# Patient Record
Sex: Female | Born: 1980 | Race: Black or African American | Hispanic: No | Marital: Single | Smoking: Former smoker
Health system: Southern US, Community
[De-identification: ages and names within clinical notes are randomized; demographics above are authoritative.]

## PROBLEM LIST (undated history)

## (undated) DIAGNOSIS — F329 Major depressive disorder, single episode, unspecified: Secondary | ICD-10-CM

## (undated) DIAGNOSIS — F419 Anxiety disorder, unspecified: Secondary | ICD-10-CM

## (undated) DIAGNOSIS — K5090 Crohn's disease, unspecified, without complications: Secondary | ICD-10-CM

## (undated) DIAGNOSIS — F32A Depression, unspecified: Secondary | ICD-10-CM

---

## 2012-01-09 HISTORY — PX: APPENDECTOMY: SHX54

## 2013-01-08 HISTORY — PX: LARYNGOSCOPY: SHX5203

## 2017-09-10 ENCOUNTER — Ambulatory Visit
Admission: EM | Admit: 2017-09-10 | Discharge: 2017-09-10 | Disposition: A | Discharge status: Home / Self Care | Payer: 59 | Attending: Family Medicine | Admitting: Family Medicine

## 2017-09-10 ENCOUNTER — Other Ambulatory Visit: Payer: Self-pay

## 2017-09-10 ENCOUNTER — Encounter: Payer: Self-pay | Admitting: Emergency Medicine

## 2017-09-10 DIAGNOSIS — Z3A12 12 weeks gestation of pregnancy: Secondary | ICD-10-CM

## 2017-09-10 DIAGNOSIS — Z3201 Encounter for pregnancy test, result positive: Secondary | ICD-10-CM

## 2017-09-10 DIAGNOSIS — R109 Unspecified abdominal pain: Secondary | ICD-10-CM | POA: Diagnosis not present

## 2017-09-10 DIAGNOSIS — K219 Gastro-esophageal reflux disease without esophagitis: Secondary | ICD-10-CM | POA: Diagnosis not present

## 2017-09-10 DIAGNOSIS — R112 Nausea with vomiting, unspecified: Secondary | ICD-10-CM | POA: Diagnosis not present

## 2017-09-10 HISTORY — DX: Crohn's disease, unspecified, without complications: K50.90

## 2017-09-10 LAB — PREGNANCY, URINE: Preg Test, Ur: POSITIVE — AB

## 2017-09-10 NOTE — ED Triage Notes (Addendum)
Pt c/o vomiting, nausea, and abdominal pain. Started 3 days ago and has gradually gotten worse. She had been constipated. Pt has had a low grade fever up and down since this started.

## 2017-09-10 NOTE — Discharge Instructions (Addendum)
Vitamin B6, natural ginger products TUMS  Prenatal vitamin daily Increase fluids Establish care with OB

## 2017-09-10 NOTE — ED Provider Notes (Signed)
[facility]    CSN: [id number] Arrival date & time: 09/10/17  1243     History   Chief Complaint Chief Complaint  Patient presents with  . Emesis    HPI Shirley Todd is a 37 y.o. female.   37 yo female with a c/o nausea and abdominal cramping for 3 days. States she vomited once yesterday. Denies any fevers, chills, dysuria, diarrhea. States last LMP was 06/17/17.   The history is provided by the patient.    Past Medical History:  Diagnosis Date  . Crohn's disease ([place])     There are no active problems to display for this patient.   Past Surgical History:  Procedure Laterality Date  . APPENDECTOMY  2014    OB History   None      Home Medications    Prior to Admission medications   Not on File    Family History Family History  Problem Relation Age of Onset  . HIV Mother   . Diabetes Brother     Social History Social History   Tobacco Use  . Smoking status: Former Research scientist (life sciences)  . Smokeless tobacco: Never Used  Substance Use Topics  . Alcohol use: Never    Frequency: Never  . Drug use: Never     Allergies   Patient has no known allergies.   Review of Systems Review of Systems   Physical Exam Triage Vital Signs ED Triage Vitals  Enc Vitals Group     BP 09/10/17 1316 120/88     Pulse Rate 09/10/17 1316 97     Resp 09/10/17 1316 16     Temp 09/10/17 1316 98.8 F (37.1 C)     Temp Source 09/10/17 1316 Oral     SpO2 09/10/17 1316 99 %     Weight 09/10/17 1311 212 lb (96.2 kg)     Height 09/10/17 1311 5' 8"  (1.727 m)     Head Circumference --      Peak Flow --      Pain Score 09/10/17 1310 0     Pain Loc --      Pain Edu? --      Excl. in [place]? --    No data found.  Updated Vital Signs BP 120/88 (BP Location: Left Arm)   Pulse 97   Temp 98.8 F (37.1 C) (Oral)   Resp 16   Ht 5' 8"  (1.727 m)   Wt 96.2 kg   LMP 06/17/2017   SpO2 99%   BMI 32.23 kg/m   Visual Acuity Right Eye Distance:   Left Eye Distance:     Bilateral Distance:    Right Eye Near:   Left Eye Near:    Bilateral Near:     Physical Exam  Constitutional: She appears well-developed and well-nourished. No distress.  Abdominal: Soft. Bowel sounds are normal. She exhibits no distension and no mass. There is no tenderness. There is no rebound and no guarding.  Skin: She is not diaphoretic.  Nursing note and vitals reviewed.    UC Treatments / Results  Labs (all labs ordered are listed, but only abnormal results are displayed) Labs Reviewed  PREGNANCY, URINE - Abnormal; Notable for the following components:      Result Value   Preg Test, Ur POSITIVE (*)    All other components within normal limits    EKG None  Radiology No results found.  Procedures Procedures (including critical care time)  Medications Ordered in UC Medications - No  data to display  Initial Impression / Assessment and Plan / UC Course  I have reviewed the triage vital signs and the nursing notes.  Pertinent labs & imaging results that were available during my care of the patient were reviewed by me and considered in my medical decision making (see chart for details).      Final Clinical Impressions(s) / UC Diagnoses   Final diagnoses:  Non-intractable vomiting with nausea, unspecified vomiting type  [redacted] weeks gestation of pregnancy  Gastroesophageal reflux disease, esophagitis presence not specified     Discharge Instructions     Vitamin B6, natural ginger products TUMS  Prenatal vitamin daily Increase fluids Establish care with [facility]     ED Prescriptions    None     1. Lab results and diagnosis reviewed with patient 2. Recommend supportive treatment as above 3. Follow-up prn if symptoms worsen or don't improve  Controlled Substance Prescriptions [place] Controlled Substance Registry consulted? Not Applicable   Norval Gable, MD 09/10/17 1447

## 2017-09-13 ENCOUNTER — Other Ambulatory Visit: Payer: Self-pay | Admitting: Obstetrics and Gynecology

## 2017-09-13 DIAGNOSIS — Z369 Encounter for antenatal screening, unspecified: Secondary | ICD-10-CM

## 2017-09-16 ENCOUNTER — Ambulatory Visit
Admission: RE | Admit: 2017-09-16 | Discharge: 2017-09-16 | Disposition: A | Discharge status: Home / Self Care | Payer: 59 | Source: Ambulatory Visit | Attending: Obstetrics and Gynecology | Admitting: Obstetrics and Gynecology

## 2017-09-16 VITALS — BP 114/65 | HR 103 | Temp 98.0°F | Resp 16 | Wt 211.4 lb

## 2017-09-16 DIAGNOSIS — O09891 Supervision of other high risk pregnancies, first trimester: Secondary | ICD-10-CM | POA: Diagnosis present

## 2017-09-16 DIAGNOSIS — K5010 Crohn's disease of large intestine without complications: Secondary | ICD-10-CM | POA: Insufficient documentation

## 2017-09-16 DIAGNOSIS — Z3A13 13 weeks gestation of pregnancy: Secondary | ICD-10-CM | POA: Diagnosis not present

## 2017-09-16 DIAGNOSIS — O09521 Supervision of elderly multigravida, first trimester: Secondary | ICD-10-CM | POA: Insufficient documentation

## 2017-09-16 DIAGNOSIS — Z369 Encounter for antenatal screening, unspecified: Secondary | ICD-10-CM

## 2017-09-16 DIAGNOSIS — N979 Female infertility, unspecified: Secondary | ICD-10-CM | POA: Insufficient documentation

## 2017-09-16 NOTE — Progress Notes (Signed)
Referring Provider:   Hassan Buckler Length of Consultation: 30 minutes  Ms. Riling was referred to [facility] for genetic counseling because of advanced maternal age. The patient will be 37 years old at the time of delivery. She was 74w0dat the time of our appointment. Of note, the pregnancy was conceived using IUI and a sperm donor. This note summarizes the information we discussed.    Genetic Counseling We explained that the chance of a chromosome abnormality increases with maternal age.  Chromosomes and examples of chromosome problems were reviewed.  Humans typically have 46 chromosomes in each cell, with half passed through each sperm and egg.  Any change in the number or structure of chromosomes can increase the risk of problems in the physical and mental development of a pregnancy.   Based upon age of the patient, the chance of any chromosome abnormality was 1 in 663 The chance of Down syndrome, the most common chromosome problem associated with maternal age, was 1 in 114  The risk of chromosome problems is in addition to the 3% general population risk for birth defects and mental retardation.  The greatest chance, of course, is that the baby would be born in good health.  We discussed the following prenatal screening and testing options for this pregnancy:  First trimester screening, which includes nuchal translucency ultrasound screen and first trimester maternal serum marker screening.  The nuchal translucency has approximately an 80% detection rate for Down syndrome and can be positive for other chromosome abnormalities as well as heart defects.  When combined with a maternal serum marker screening, the detection rate is up to 90% for Down syndrome and up to 97% for trisomy 18.     The chorionic villus sampling procedure is available for first trimester chromosome analysis.  This involves the withdrawal of a small amount of chorionic villi (tissue from the  developing placenta).  Risk of pregnancy loss is estimated to be approximately 1 in 200 to 1 in 100 (0.5 to 1%).  There is approximately a 1% (1 in 100) chance that the CVS chromosome results will be unclear.  Chorionic villi cannot be tested for neural tube defects.     Maternal serum marker screening, a blood test that measures pregnancy proteins, can provide risk assessments for Down syndrome, trisomy 18, and open neural tube defects (spina bifida, anencephaly). Because it does not directly examine the fetus, it cannot positively diagnose or rule out these problems.  Targeted ultrasound uses high frequency sound waves to create an image of the developing fetus.  An ultrasound is often recommended as a routine means of evaluating the pregnancy.  It is also used to screen for fetal anatomy problems (for example, a heart defect) that might be suggestive of a chromosomal or other abnormality.   Amniocentesis involves the removal of a small amount of amniotic fluid from the sac surrounding the fetus with the use of a thin needle inserted through the maternal abdomen and uterus.  Ultrasound guidance is used throughout the procedure.  Fetal cells from amniotic fluid are directly evaluated and > 99.5% of chromosome problems and > 98% of open neural tube defects can be detected. This procedure is generally performed after the 15th week of pregnancy.  The main risks to this procedure include complications leading to miscarriage in less than 1 in 200 cases (0.5%).  We also reviewed the availability of cell free fetal DNA testing from maternal blood to determine whether or not the  baby may have either Down syndrome, trisomy 39, or trisomy 36.  This test utilizes a maternal blood sample and DNA sequencing technology to isolate circulating cell free fetal DNA from maternal plasma.  The fetal DNA can then be analyzed for DNA sequences that are derived from the three most common chromosomes involved in aneuploidy,  chromosomes 13, 18, and 21.  If the overall amount of DNA is greater than the expected level for any of these chromosomes, aneuploidy is suspected.  While we do not consider it a replacement for invasive testing and karyotype analysis, a negative result from this testing would be reassuring, though not a guarantee of a normal chromosome complement for the baby.  An abnormal result is certainly suggestive of an abnormal chromosome complement, though we would still recommend CVS or amniocentesis to confirm any findings from this testing.  Cystic Fibrosis and Spinal Muscular Atrophy (SMA) screening were also discussed with the patient. Both conditions are recessive, which means that both parents must be carriers in order to have a child with the disease.  Cystic fibrosis (CF) is one of the most common genetic conditions in persons of Caucasian ancestry.  This condition occurs in approximately 1 in 2,500 Caucasian persons and results in thickened secretions in the lungs, digestive, and reproductive systems.  For a baby to be at risk for having CF, both of the parents must be carriers for this condition.  Approximately 1 in 59 Caucasian persons is a carrier for CF.  Current carrier testing looks for the most common mutations in the gene for CF and can detect approximately 90% of carriers in the Caucasian population.  This means that the carrier screening can greatly reduce, but cannot eliminate, the chance for an individual to have a child with CF.  If an individual is found to be a carrier for CF, then carrier testing would be available for the partner. As part of [place] newborn screening profile, all babies born in the state of [place] will have a two-tier screening process.  Specimens are first tested to determine the concentration of immunoreactive trypsinogen (IRT).  The top 5% of specimens with the highest IRT values then undergo DNA testing using a panel of over 40 common CF mutations. SMA is a  neurodegenerative disorder that leads to atrophy of skeletal muscle and overall weakness.  This condition is also more prevalent in the Caucasian population, with 1 in 40-1 in 60 persons being a carrier and 1 in 6,000-1 in 10,000 children being affected.  There are multiple forms of the disease, with some causing death in infancy to other forms with survival into adulthood.  The genetics of SMA is complex, but carrier screening can detect up to 95% of carriers in the Caucasian population.  Similar to CF, a negative result can greatly reduce, but cannot eliminate, the chance to have a child with SMA.  Ms. Druckenmiller used a sperm donor for this pregnancy. She showed [place] reports of his workup from [place] that included a normal karyotype, negative expanded carrier screening for 102 conditions including CF and hemoglobinopathies, and negative SMA testing. Given the donor's results returned with a reduced carrier risk, Ms. Behe did not elect to pursue carrier screening for CF and SMA. Hemoglobin electrophoresis was drawn on 09/13/17, and results are pending.  Pregnancy/Family History We obtained a detailed family history and pregnancy history. This is Ms. Ensz's second pregnancy, G20010. She had a termination at 4 months gestation when she was 37 years old.  Ms. Mance has Crohn's disease. Her paternal half brother died from complications following a pancreatic and kidney transplant for diabetes. Her maternal half sister has been worked up for infertility, believed to be related to a lack of ovulation. The remainder of the family history is unremarkable for birth defects, developmental delays, recurrent pregnancy loss, consanguinity, or known chromosome abnormalities. Ms. Hasley is of African American and European ancestry. The donor is of [place] ancestry.   Ms. Equihua experienced spotting at 9w. She also has a UTI. She is currently taking nausea medication, and is not taking prenatal vitamins to help with her  constipation related to Crohn's disease. She reported she is not using drugs, alcohol, or tobacco.   Plan After consideration of the options, Ms. Henson elected to not proceed with screening or diagnostic testing. She may consider pursuing testing or screening in the event of an ultrasound finding.  An ultrasound was performed at the time of the visit.  The gestational age was consistent with [redacted]w[redacted]d Fetal anatomy could not be assessed due to early gestational age.  Please refer to the ultrasound report for details of that study.  Ms. JOconnorwas encouraged to call with questions or concerns.  We can be contacted at ([phone]   Tests Ordered: none  I saw the patient - she declined testing beyond ultrasound - I agree with the genetic counselors assessment LGatha Mayer MD

## 2017-11-12 ENCOUNTER — Encounter: Payer: Self-pay | Admitting: Emergency Medicine

## 2017-11-12 ENCOUNTER — Emergency Department
Admission: EM | Admit: 2017-11-12 | Discharge: 2017-11-12 | Disposition: A | Discharge status: Home / Self Care | Payer: 59 | Source: Home / Self Care | Attending: Emergency Medicine | Admitting: Emergency Medicine

## 2017-11-12 ENCOUNTER — Other Ambulatory Visit: Payer: Self-pay

## 2017-11-12 ENCOUNTER — Observation Stay
Admission: RE | Admit: 2017-11-12 | Discharge: 2017-11-12 | Disposition: A | Discharge status: Home / Self Care | Payer: 59 | Attending: Certified Nurse Midwife | Admitting: Certified Nurse Midwife

## 2017-11-12 DIAGNOSIS — O212 Late vomiting of pregnancy: Principal | ICD-10-CM | POA: Insufficient documentation

## 2017-11-12 DIAGNOSIS — K5900 Constipation, unspecified: Secondary | ICD-10-CM

## 2017-11-12 DIAGNOSIS — K5090 Crohn's disease, unspecified, without complications: Secondary | ICD-10-CM | POA: Insufficient documentation

## 2017-11-12 DIAGNOSIS — O99612 Diseases of the digestive system complicating pregnancy, second trimester: Secondary | ICD-10-CM | POA: Diagnosis not present

## 2017-11-12 DIAGNOSIS — O219 Vomiting of pregnancy, unspecified: Secondary | ICD-10-CM | POA: Insufficient documentation

## 2017-11-12 DIAGNOSIS — Z3A21 21 weeks gestation of pregnancy: Secondary | ICD-10-CM | POA: Insufficient documentation

## 2017-11-12 DIAGNOSIS — Z87891 Personal history of nicotine dependence: Secondary | ICD-10-CM | POA: Insufficient documentation

## 2017-11-12 DIAGNOSIS — R112 Nausea with vomiting, unspecified: Secondary | ICD-10-CM

## 2017-11-12 DIAGNOSIS — O09521 Supervision of elderly multigravida, first trimester: Secondary | ICD-10-CM

## 2017-11-12 HISTORY — DX: Anxiety disorder, unspecified: F41.9

## 2017-11-12 HISTORY — DX: Depression, unspecified: F32.A

## 2017-11-12 HISTORY — DX: Major depressive disorder, single episode, unspecified: F32.9

## 2017-11-12 LAB — COMPREHENSIVE METABOLIC PANEL
ALT: 16 U/L (ref 0–44)
AST: 17 U/L (ref 15–41)
Albumin: 3.3 g/dL — ABNORMAL LOW (ref 3.5–5.0)
Alkaline Phosphatase: 60 U/L (ref 38–126)
Anion gap: 6 (ref 5–15)
BUN: 7 mg/dL (ref 6–20)
CALCIUM: 8.9 mg/dL (ref 8.9–10.3)
CO2: 25 mmol/L (ref 22–32)
CREATININE: 0.53 mg/dL (ref 0.44–1.00)
Chloride: 107 mmol/L (ref 98–111)
GFR calc non Af Amer: >60 mL/min (ref >60)
Glucose, Bld: 77 mg/dL (ref 70–99)
Potassium: 3.3 mmol/L — ABNORMAL LOW (ref 3.5–5.1)
SODIUM: 138 mmol/L (ref 135–145)
Total Bilirubin: 0.4 mg/dL (ref 0.3–1.2)
Total Protein: 7 g/dL (ref 6.5–8.1)

## 2017-11-12 LAB — CBC
HCT: 33.7 % — ABNORMAL LOW (ref 36.0–46.0)
Hemoglobin: 10.9 g/dL — ABNORMAL LOW (ref 12.0–15.0)
MCH: 31.1 pg (ref 26.0–34.0)
MCHC: 32.3 g/dL (ref 30.0–36.0)
MCV: 96 fL (ref 80.0–100.0)
NRBC: 0 % (ref 0.0–0.2)
PLATELETS: 285 10*3/uL (ref 150–400)
RBC: 3.51 MIL/uL — ABNORMAL LOW (ref 3.87–5.11)
RDW: 12.7 % (ref 11.5–15.5)
WBC: 11.9 10*3/uL — ABNORMAL HIGH (ref 4.0–10.5)

## 2017-11-12 LAB — URINALYSIS, COMPLETE (UACMP) WITH MICROSCOPIC
BILIRUBIN URINE: NEGATIVE
GLUCOSE, UA: NEGATIVE mg/dL
HGB URINE DIPSTICK: NEGATIVE
Ketones, ur: 5 mg/dL — AB
LEUKOCYTES UA: NEGATIVE
Nitrite: NEGATIVE
Protein, ur: NEGATIVE mg/dL
SPECIFIC GRAVITY, URINE: 1.023 (ref 1.005–1.030)
pH: 5 (ref 5.0–8.0)

## 2017-11-12 LAB — LIPASE, BLOOD: Lipase: 36 U/L (ref 11–51)

## 2017-11-12 MED ORDER — PROMETHAZINE HCL 12.5 MG PO TABS: 12.5000 mg | ORAL_TABLET | Freq: Four times a day (QID) | ORAL | 0 refills | Status: DC | PRN

## 2017-11-12 MED ORDER — DOXYLAMINE SUCCINATE (SLEEP) 25 MG PO TABS
25.0000 mg | ORAL_TABLET | Freq: Every day | ORAL | Status: DC
  Filled 2017-11-12: qty 1

## 2017-11-12 MED ORDER — PROMETHAZINE HCL 25 MG PO TABS: 12.5000 mg | ORAL_TABLET | ORAL | Status: DC | PRN

## 2017-11-12 MED ORDER — MAGNESIUM CITRATE PO SOLN
0.5000 | Freq: Once | ORAL | Status: AC
  Administered 2017-11-12: 0.5 via ORAL
  Filled 2017-11-12 (×2): qty 296

## 2017-11-12 MED ORDER — PYRIDOXINE HCL 50 MG PO TABS: 50.0000 mg | ORAL_TABLET | Freq: Three times a day (TID) | ORAL | 1 refills | Status: AC

## 2017-11-12 MED ORDER — METOCLOPRAMIDE HCL 10 MG PO TABS: 10.0000 mg | ORAL_TABLET | Freq: Four times a day (QID) | ORAL | 1 refills | Status: AC

## 2017-11-12 MED ORDER — DOCUSATE SODIUM 50 MG/5ML PO LIQD
100.0000 mg | Freq: Once | ORAL | Status: AC
  Administered 2017-11-12: 100 mg
  Filled 2017-11-12 (×2): qty 10

## 2017-11-12 MED ORDER — PROMETHAZINE HCL 25 MG RE SUPP
25.0000 mg | RECTAL | Status: DC | PRN
  Filled 2017-11-12: qty 1

## 2017-11-12 MED ORDER — DEXTROSE 5 % AND 0.45 % NACL IV BOLUS
500.0000 mL | Freq: Once | INTRAVENOUS | Status: AC
  Administered 2017-11-12: 500 mL via INTRAVENOUS

## 2017-11-12 MED ORDER — DEXTROSE 5 % AND 0.45 % NACL IV BOLUS: 1000.0000 mL | Freq: Once | INTRAVENOUS | Status: DC

## 2017-11-12 MED ORDER — PROMETHAZINE HCL 25 MG RE SUPP: 25.0000 mg | RECTAL | 2 refills | Status: AC | PRN

## 2017-11-12 MED ORDER — PROMETHAZINE HCL 25 MG/ML IJ SOLN
INTRAMUSCULAR | Status: AC
  Filled 2017-11-12: qty 1

## 2017-11-12 MED ORDER — METOCLOPRAMIDE HCL 10 MG PO TABS
10.0000 mg | ORAL_TABLET | Freq: Four times a day (QID) | ORAL | Status: DC
  Administered 2017-11-12: 10 mg via ORAL
  Filled 2017-11-12: qty 1

## 2017-11-12 MED ORDER — ONDANSETRON 4 MG PO TBDP: 4.0000 mg | ORAL_TABLET | Freq: Four times a day (QID) | ORAL | Status: DC | PRN

## 2017-11-12 MED ORDER — VITAMIN B-6 50 MG PO TABS
50.0000 mg | ORAL_TABLET | Freq: Three times a day (TID) | ORAL | Status: DC
  Filled 2017-11-12: qty 1

## 2017-11-12 MED ORDER — PROMETHAZINE HCL 25 MG PO TABS: 12.5000 mg | ORAL_TABLET | Freq: Once | ORAL | Status: DC

## 2017-11-12 MED ORDER — PROMETHAZINE HCL 25 MG/ML IJ SOLN
12.5000 mg | Freq: Once | INTRAMUSCULAR | Status: AC
  Administered 2017-11-12: 12.5 mg via INTRAVENOUS

## 2017-11-12 MED ORDER — ONDANSETRON 4 MG PO TBDP: 4.0000 mg | ORAL_TABLET | Freq: Four times a day (QID) | ORAL | 1 refills | Status: AC | PRN

## 2017-11-12 MED ORDER — PROMETHAZINE HCL 12.5 MG PO TABS: 12.5000 mg | ORAL_TABLET | ORAL | 1 refills | Status: AC | PRN

## 2017-11-12 MED ORDER — DOXYLAMINE SUCCINATE (SLEEP) 25 MG PO TABS: 25.0000 mg | ORAL_TABLET | Freq: Every day | ORAL | 0 refills | Status: AC

## 2017-11-12 NOTE — Discharge Instructions (Signed)
Take medications as needed. Call back with any questions and go to scheduled appointments.

## 2017-11-12 NOTE — ED Notes (Signed)
Medicated soap suds enema being infused at this time.  Pt denies any discomfort.

## 2017-11-12 NOTE — ED Triage Notes (Signed)
Pt reports that she has had N/V all through her pregnancy they have changed her Nausea medication three times. The Zofran has constipated her, has not been able to have a BM since last Sunday that has caused her to have Chrons. She also has not been able to keep anything down since Saturday with the emesis.

## 2017-11-12 NOTE — Discharge Summary (Signed)
Shirley Todd is a 37 y.o. female. She is at 16w1dgestation. Patient's last menstrual period was 06/17/2017. Estimated Date of Delivery: None noted.  [facility]  Chief complaint: nausea and vomiting Location: stomach Onset/timing: all pregnancy Duration: intermittent, daily basis Quality: nausea, vomiting Severity: mild to moderate Aggravating or alleviating conditions: none Associated signs/symptoms: none Context: Kameelah presented to the ED today with reports of not having a bowel movement in 10 days and nausea and vomiting. She reports trying multiple remedies at home, including drinking warm water, prune juice, Metamucil, and MiraLAX without resulting bowel movement. She has tried doxylamine and Zofran for the nausea, but is still vomiting regularly. She reports that she moved her bowels in the emergency department after magnesium citrate and enema. She tolerated some fluids and crackers for a bit, but ended up throwing them up. She has a history of Crohn's disease, but is not taking any Crohn's medication at this time and last took medication for Crohn's apprroximately 2 years ago and only took it for 6 months to "induce remission."    S: Resting comfortably.   She reports:  -no leakage of fluid -no vaginal bleeding -no contractions  Maternal Medical History:   Past Medical History:  Diagnosis Date  . Anxiety   . [place]   . Depression     Past Surgical History:  Procedure Laterality Date  . APPENDECTOMY  2014  . LARYNGOSCOPY  2015    No Known Allergies  Prior to Admission medications   Medication Sig Start Date End Date Taking? Authorizing Provider  Doxylamine-Pyridoxine (DICLEGIS PO) Take by mouth.    [provider]  promethazine (PHENERGAN) 12.5 MG tablet Take 1 tablet (12.5 mg total) by mouth every 6 (six) hours as needed for nausea or vomiting. 11/12/17   Schaevitz, DRandall An MD     Social  History: She  reports that she has quit smoking. She has never used smokeless tobacco. She reports that she does not drink alcohol or use drugs.  Family History: family history includes Diabetes in her brother; HIV in her mother.   Review of Systems: A full review of systems was performed and negative except as noted in the HPI.    O:  BP 111/65 (BP Location: Left Arm)   Pulse 83   Temp 98 F (36.7 C) (Oral)   Resp 16   Ht 5' 8"  (1.727 m)   Wt 99.8 kg   LMP 06/17/2017   BMI 33.45 kg/m  Results for orders placed or performed during the hospital encounter of 11/12/17 (from the past 48 hour(s))  Lipase, blood   Collection Time: 11/12/17 12:40 PM  Result Value Ref Range   Lipase 36 11 - 51 U/L  Comprehensive metabolic panel   Collection Time: 11/12/17 12:40 PM  Result Value Ref Range   Sodium 138 135 - 145 mmol/L   Potassium 3.3 (L) 3.5 - 5.1 mmol/L   Chloride 107 98 - 111 mmol/L   CO2 25 22 - 32 mmol/L   Glucose, Bld 77 70 - 99 mg/dL   BUN 7 6 - 20 mg/dL   Creatinine, Ser 0.53 0.44 - 1.00 mg/dL   Calcium 8.9 8.9 - 10.3 mg/dL   Total Protein 7.0 6.5 - 8.1 g/dL   Albumin 3.3 (L) 3.5 - 5.0 g/dL   AST 17 15 - 41 U/L   ALT 16 0 - 44 U/L   Alkaline Phosphatase 60 38 - 126 U/L  Total Bilirubin 0.4 0.3 - 1.2 mg/dL   GFR calc non Af Amer >60 >60 mL/min   GFR calc Af Amer >60 >60 mL/min   Anion gap 6 5 - 15  CBC   Collection Time: 11/12/17 12:40 PM  Result Value Ref Range   WBC 11.9 (H) 4.0 - 10.5 K/uL   RBC 3.51 (L) 3.87 - 5.11 MIL/uL   Hemoglobin 10.9 (L) 12.0 - 15.0 g/dL   HCT 33.7 (L) 36.0 - 46.0 %   MCV 96.0 80.0 - 100.0 fL   MCH 31.1 26.0 - 34.0 pg   MCHC 32.3 30.0 - 36.0 g/dL   RDW 12.7 11.5 - 15.5 %   Platelets 285 150 - 400 K/uL   nRBC 0.0 0.0 - 0.2 %  Urinalysis, Complete w Microscopic   Collection Time: 11/12/17 12:40 PM  Result Value Ref Range   Color, Urine AMBER (A) YELLOW   APPearance CLOUDY (A) CLEAR   Specific Gravity, Urine 1.023 1.005 - 1.030   pH  5.0 5.0 - 8.0   Glucose, UA NEGATIVE NEGATIVE mg/dL   Hgb urine dipstick NEGATIVE NEGATIVE   Bilirubin Urine NEGATIVE NEGATIVE   Ketones, ur 5 (A) NEGATIVE mg/dL   Protein, ur NEGATIVE NEGATIVE mg/dL   Nitrite NEGATIVE NEGATIVE   Leukocytes, UA NEGATIVE NEGATIVE   RBC / HPF 0-5 0 - 5 RBC/hpf   WBC, UA 6-10 0 - 5 WBC/hpf   Bacteria, UA MANY (A) NONE SEEN   Squamous Epithelial / LPF 11-20 0 - 5   Mucus PRESENT    Ca Oxalate Crys, UA PRESENT      Constitutional: NAD, AAOx3  HE/ENT: extraocular movements grossly intact, moist mucous membranes CV: RRR PULM: normal respiratory effort, CTABL     Abd: gravid, non-tender, non-distended, soft      Ext: Non-tender, Nonedmeatous   Psych: mood appropriate, speech normal Pelvic deferred  FHT: 155bpm by doppler Toco: quiet  A/P: 37 y.o. 42w1dhere for antenatal surveillance during pregnancy.  Principle diagnosis: nausea and vomiting during pregnancy  Labor  Not present  Fetal Wellbeing  FHTs reassuring for gestational age  Nausea and vomiting  Tolerating clears and crackers without emesis.   Reviewed need for scheduled anti-emetics with persistent N/V in second trimester. Prescribed Phenergan PO or PR q4-6h, Reglan PO q6h, vitamin B6 552mQID with meals and bedtime, and doxylamine 2539mt bedtime.   D/c home stable, precautions reviewed, follow-up as scheduled.    Discussed with Dr. BeaLeafy Roho is in agreement with this plan.   MarLisette Grinder/05/2017 9:02 PM  ----- MarLisette GrinderNM Certified Nurse Midwife [facility]

## 2017-11-12 NOTE — ED Notes (Signed)
Pt feels as if she is unable to hold enema any longer, up to bedside commode at this time.

## 2017-11-12 NOTE — OB Triage Note (Signed)
Pt G1P0 20w1dcomplains of emesis through the whole pregnancy. Pt states she has not passed a BM in 10 days, but passed  BM in ED "because they gave her an enema". [place], CNM at bedside to evaluate pt and discuss POC.

## 2017-11-12 NOTE — Progress Notes (Signed)
Shirley Todd is a 37 y.o. female. She is at 19w1dgestation. Patient's last menstrual period was 06/17/2017. Estimated Date of Delivery: None noted.  [facility]  Chief complaint: nausea and vomiting Location: stomach Onset/timing: all pregnancy Duration: intermittent, daily basis Quality: nausea, vomiting Severity: mild to moderate Aggravating or alleviating conditions: none Associated signs/symptoms: none Context: Shirley Todd presented to the ED today with reports of not having a bowel movement in 10 days and nausea and vomiting. She reports trying multiple remedies at home, including drinking warm water, prune juice, Metamucil, and MiraLAX without resulting bowel movement. She has tried doxylamine and Zofran for the nausea, but is still vomiting regularly. She reports that she moved her bowels in the emergency department after magnesium citrate and enema. She tolerated some fluids and crackers for a bit, but ended up throwing them up. She has a history of Crohn's disease, but is not taking any Crohn's medication at this time and last took medication for Crohn's apprroximately 2 years ago and only took it for 6 months to "induce remission."    S: Resting comfortably.   She reports:  -no leakage of fluid -no vaginal bleeding -no contractions  Maternal Medical History:   Past Medical History:  Diagnosis Date  . Anxiety   . Crohn's disease ([place]   . Depression     Past Surgical History:  Procedure Laterality Date  . APPENDECTOMY  2014  . LARYNGOSCOPY  2015    No Known Allergies  Prior to Admission medications   Medication Sig Start Date End Date Taking? Authorizing Provider  Doxylamine-Pyridoxine (DICLEGIS PO) Take by mouth.    [provider]  promethazine (PHENERGAN) 12.5 MG tablet Take 1 tablet (12.5 mg total) by mouth every 6 (six) hours as needed for nausea or vomiting. 11/12/17   Schaevitz, DRandall An MD     Social  History: She  reports that she has quit smoking. She has never used smokeless tobacco. She reports that she does not drink alcohol or use drugs.  Family History: family history includes Diabetes in her brother; HIV in her mother.   Review of Systems: A full review of systems was performed and negative except as noted in the HPI.    O:  BP 110/62 (BP Location: Left Arm)   Pulse 80   Temp 98 F (36.7 C) (Oral)   Resp 14   Ht 5' 8"  (1.727 m)   Wt 99.8 kg   LMP 06/17/2017   BMI 33.45 kg/m  Results for orders placed or performed during the hospital encounter of 11/12/17 (from the past 48 hour(s))  Lipase, blood   Collection Time: 11/12/17 12:40 PM  Result Value Ref Range   Lipase 36 11 - 51 U/L  Comprehensive metabolic panel   Collection Time: 11/12/17 12:40 PM  Result Value Ref Range   Sodium 138 135 - 145 mmol/L   Potassium 3.3 (L) 3.5 - 5.1 mmol/L   Chloride 107 98 - 111 mmol/L   CO2 25 22 - 32 mmol/L   Glucose, Bld 77 70 - 99 mg/dL   BUN 7 6 - 20 mg/dL   Creatinine, Ser 0.53 0.44 - 1.00 mg/dL   Calcium 8.9 8.9 - 10.3 mg/dL   Total Protein 7.0 6.5 - 8.1 g/dL   Albumin 3.3 (L) 3.5 - 5.0 g/dL   AST 17 15 - 41 U/L   ALT 16 0 - 44 U/L   Alkaline Phosphatase 60 38 - 126 U/L  Total Bilirubin 0.4 0.3 - 1.2 mg/dL   GFR calc non Af Amer >60 >60 mL/min   GFR calc Af Amer >60 >60 mL/min   Anion gap 6 5 - 15  CBC   Collection Time: 11/12/17 12:40 PM  Result Value Ref Range   WBC 11.9 (H) 4.0 - 10.5 K/uL   RBC 3.51 (L) 3.87 - 5.11 MIL/uL   Hemoglobin 10.9 (L) 12.0 - 15.0 g/dL   HCT 33.7 (L) 36.0 - 46.0 %   MCV 96.0 80.0 - 100.0 fL   MCH 31.1 26.0 - 34.0 pg   MCHC 32.3 30.0 - 36.0 g/dL   RDW 12.7 11.5 - 15.5 %   Platelets 285 150 - 400 K/uL   nRBC 0.0 0.0 - 0.2 %  Urinalysis, Complete w Microscopic   Collection Time: 11/12/17 12:40 PM  Result Value Ref Range   Color, Urine AMBER (A) YELLOW   APPearance CLOUDY (A) CLEAR   Specific Gravity, Urine 1.023 1.005 - 1.030   pH  5.0 5.0 - 8.0   Glucose, UA NEGATIVE NEGATIVE mg/dL   Hgb urine dipstick NEGATIVE NEGATIVE   Bilirubin Urine NEGATIVE NEGATIVE   Ketones, ur 5 (A) NEGATIVE mg/dL   Protein, ur NEGATIVE NEGATIVE mg/dL   Nitrite NEGATIVE NEGATIVE   Leukocytes, UA NEGATIVE NEGATIVE   RBC / HPF 0-5 0 - 5 RBC/hpf   WBC, UA 6-10 0 - 5 WBC/hpf   Bacteria, UA MANY (A) NONE SEEN   Squamous Epithelial / LPF 11-20 0 - 5   Mucus PRESENT    Ca Oxalate Crys, UA PRESENT      Constitutional: NAD, AAOx3  HE/ENT: extraocular movements grossly intact, moist mucous membranes CV: RRR PULM: normal respiratory effort, CTABL     Abd: gravid, non-tender, non-distended, soft      Ext: Non-tender, Nonedmeatous   Psych: mood appropriate, speech normal Pelvic deferred  FHT: 155bpm by doppler Toco: quiet  A/P: 37 y.o. 28w1dhere for antenatal surveillance during pregnancy.  Principle diagnosis: nausea and vomiting during pregnancy  Labor  Not present  Fetal Wellbeing  FHTs reassuring for gestational age  Nausea and vomiting  Tolerating clears and crackers without emesis.   Reviewed need for scheduled anti-emetics with persistent N/V in second trimester. Prescribed Phenergan PO or PR q4-6h, Reglan PO q6h, vitamin B6 586mQID with meals and bedtime, and doxylamine 2532mt bedtime.   D/c home stable, precautions reviewed, follow-up as scheduled.    Discussed with Dr. BeaLeafy Roho is in agreement with this plan.   MarLisette Grinder/05/2017 7:56 PM  ----- MarLisette GrinderNM Certified Nurse Midwife [facility]

## 2017-11-12 NOTE — ED Provider Notes (Addendum)
[facility] Emergency Department Provider Note ____________________________________________   First MD Initiated Contact with Patient 11/12/17 1407     (approximate)  I have reviewed the triage vital signs and the nursing notes.   HISTORY  Chief Complaint Emesis During Pregnancy and Constipation   HPI Shirley Todd is a 37 y.o. female history of Crohn's disease who is approximately [redacted] weeks pregnant, patient of Dr. Leonides Schanz, who was presented emergency department today having not had a bowel movement in 10 days and who has intractable nausea and vomiting.  The patient says that she has tried multiple home remedies such as drinking warm water, prune juice, Metamucil, MiraLAX for constipation without a bowel movement.  Patient also reports that she has had doxylamine and Zofran for the nausea without success and says that she is "vomiting everything."  Says that she vomited in the emergency department as well.  Does not report blood in her vomit or her stool.  Says that she is not taking any Crohn's medication at this time and says that the last Crohn's medication she had taken was approximately 2 years ago and only took it for 6 months to "induce remission."  She is denying any abdominal pain.  Fetal heart tones of 146 in the waiting room.  Denies any vaginal bleeding or discharge.   Past Medical History:  Diagnosis Date  . Crohn's disease [facility])     Patient Active Problem List   Diagnosis Date Noted  . Infertility, female 09/16/2017  . Crohn's colitis ([place]) 09/16/2017  . AMA (advanced maternal age) multigravida 74+, first trimester 09/16/2017    Past Surgical History:  Procedure Laterality Date  . APPENDECTOMY  2014    Prior to Admission medications   Medication Sig Start Date End Date Taking? Authorizing Provider  Doxylamine-Pyridoxine (DICLEGIS PO) Take by mouth.    [provider]    Allergies Patient has no known allergies.  Family  History  Problem Relation Age of Onset  . HIV Mother   . Diabetes Brother     Social History Social History   Tobacco Use  . Smoking status: Former Research scientist (life sciences)  . Smokeless tobacco: Never Used  Substance Use Topics  . Alcohol use: Never    Frequency: Never  . Drug use: Never    Review of Systems  Constitutional: No fever/chills Eyes: No visual changes. ENT: No sore throat. Cardiovascular: Denies chest pain. Respiratory: Denies shortness of breath. Gastrointestinal: No abdominal pain.  No constipation. Genitourinary: Negative for dysuria. Musculoskeletal: Negative for back pain. Skin: Negative for rash. Neurological: Negative for headaches, focal weakness or numbness.   ____________________________________________   PHYSICAL EXAM:  VITAL SIGNS: ED Triage Vitals [11/12/17 1232]  Enc Vitals Group     BP 137/76     Pulse Rate (!) 101     Resp 20     Temp 98.6 F (37 C)     Temp Source Oral     SpO2 99 %     Weight 220 lb (99.8 kg)     Height 5' 8"  (1.727 m)     Head Circumference      Peak Flow      Pain Score 7     Pain Loc      Pain Edu?      Excl. in [place]?     Constitutional: Alert and oriented. Well appearing and in no acute distress. Eyes: Conjunctivae are normal.  Head: Atraumatic. Nose: No congestion/rhinnorhea. Mouth/Throat: Mucous membranes are moist.  Neck: No stridor.   Cardiovascular: Normal rate, regular rhythm. Grossly normal heart sounds.  Good peripheral circulation. Respiratory: Normal respiratory effort.  No retractions. Lungs CTAB. Gastrointestinal: Soft and nontender.  Gravid uterus palpated.  No CVA tenderness.  Digital rectal exam without impaction.  No blood, grossly on the glove. Musculoskeletal: No lower extremity tenderness nor edema.  No joint effusions. Neurologic:  Normal speech and language. No gross focal neurologic deficits are appreciated. Skin:  Skin is warm, dry and intact. No rash noted. Psychiatric: Mood and affect are  normal. Speech and behavior are normal.  ____________________________________________   LABS (all labs ordered are listed, but only abnormal results are displayed)  Labs Reviewed  COMPREHENSIVE METABOLIC PANEL - Abnormal; Notable for the following components:      Result Value   Potassium 3.3 (*)    Albumin 3.3 (*)    All other components within normal limits  CBC - Abnormal; Notable for the following components:   WBC 11.9 (*)    RBC 3.51 (*)    Hemoglobin 10.9 (*)    HCT 33.7 (*)    All other components within normal limits  URINALYSIS, COMPLETE (UACMP) WITH MICROSCOPIC - Abnormal; Notable for the following components:   Color, Urine AMBER (*)    APPearance CLOUDY (*)    Ketones, ur 5 (*)    Bacteria, UA MANY (*)    All other components within normal limits  LIPASE, BLOOD   ____________________________________________  EKG   ____________________________________________  RADIOLOGY   ____________________________________________   PROCEDURES  Procedure(s) performed:   Procedures  Critical Care performed:   ____________________________________________   INITIAL IMPRESSION / ASSESSMENT AND PLAN / ED COURSE  Pertinent labs & imaging results that were available during my care of the patient were reviewed by me and considered in my medical decision making (see chart for details).  DDX: Constipation, impaction, nausea and vomiting of pregnancy, bowel obstruction, electrolyte abnormality, Crohn's flare As part of my medical decision making, I reviewed the following data within the [place] Notes from prior ED visits  Feel less likely to be bowel obstruction secondary to abdomen nontender.  Will proceed with an enema as well as fluids and Phenergan.  ----------------------------------------- 4:38 PM on 11/12/2017 -----------------------------------------  Patient had a bowel movement after the enema.  Now complaining of cramping lower  abdominal pain.  However, when I reexamined her she is nontender.  Patient also able to tolerate water as well as crackers.  Will discharge home with a short course of Phenergan and she will follow-up with her OB[facility]GYN, [facility].  Patient presentation does not appear to be related to Crohn's disease as far as a Crohn's flare.  No bleeding.  Patient does not appear obstructed as she has had a bowel movement at this time and is tolerating p.o. without further episodes of vomiting.  Because she is pregnant I would only perform a CAT scan to find a very high suspicion for obstruction or the patient appeared toxic and I was suspicious for abscess or perforation.  I believe that outpatient follow would be appropriate at this time.  Patient understanding the diagnosis as well as treatment and willing to comply.  Will be discharged.  We will continue using home remedies for constipation. ____________________________________________   FINAL CLINICAL IMPRESSION(S) / ED DIAGNOSES  Nausea and vomiting.  Constipation.  NEW MEDICATIONS STARTED DURING THIS VISIT:  New Prescriptions   No medications on file     Note:  This document  was prepared using Systems analyst and may include unintentional dictation errors.     Orbie Pyo, MD 11/12/17 1642  Patient to be discharged to labor and delivery for monitoring.  Patient says that she had called on-call nursing prior to arrival to the emergency department and they are expecting her arrival after evaluation in the emergency department.   Orbie Pyo, MD 11/12/17 [phone]

## 2018-01-14 ENCOUNTER — Encounter: Payer: Self-pay | Admitting: Emergency Medicine

## 2018-01-14 ENCOUNTER — Emergency Department
Admission: EM | Admit: 2018-01-14 | Discharge: 2018-01-14 | Disposition: A | Discharge status: Home / Self Care | Payer: 59 | Attending: Emergency Medicine | Admitting: Emergency Medicine

## 2018-01-14 DIAGNOSIS — O210 Mild hyperemesis gravidarum: Secondary | ICD-10-CM | POA: Insufficient documentation

## 2018-01-14 DIAGNOSIS — Z3A30 30 weeks gestation of pregnancy: Secondary | ICD-10-CM | POA: Insufficient documentation

## 2018-01-14 DIAGNOSIS — Z79899 Other long term (current) drug therapy: Secondary | ICD-10-CM | POA: Insufficient documentation

## 2018-01-14 DIAGNOSIS — Z87891 Personal history of nicotine dependence: Secondary | ICD-10-CM | POA: Insufficient documentation

## 2018-01-14 DIAGNOSIS — F419 Anxiety disorder, unspecified: Secondary | ICD-10-CM | POA: Diagnosis not present

## 2018-01-14 DIAGNOSIS — O219 Vomiting of pregnancy, unspecified: Secondary | ICD-10-CM | POA: Diagnosis present

## 2018-01-14 DIAGNOSIS — O99343 Other mental disorders complicating pregnancy, third trimester: Secondary | ICD-10-CM | POA: Diagnosis not present

## 2018-01-14 DIAGNOSIS — F329 Major depressive disorder, single episode, unspecified: Secondary | ICD-10-CM | POA: Diagnosis not present

## 2018-01-14 LAB — URINALYSIS, ROUTINE W REFLEX MICROSCOPIC
Bilirubin Urine: NEGATIVE
Glucose, UA: NEGATIVE mg/dL
Hgb urine dipstick: NEGATIVE
KETONES UR: 80 mg/dL — AB
NITRITE: NEGATIVE
PROTEIN: 30 mg/dL — AB
Specific Gravity, Urine: 1.02 (ref 1.005–1.030)
Squamous Epithelial / LPF: >50 — ABNORMAL HIGH (ref 0–5)
pH: 6 (ref 5.0–8.0)

## 2018-01-14 LAB — CBC
HCT: 33.1 % — ABNORMAL LOW (ref 36.0–46.0)
Hemoglobin: 10.6 g/dL — ABNORMAL LOW (ref 12.0–15.0)
MCH: 31.3 pg (ref 26.0–34.0)
MCHC: 32 g/dL (ref 30.0–36.0)
MCV: 97.6 fL (ref 80.0–100.0)
NRBC: 0 % (ref 0.0–0.2)
PLATELETS: 269 10*3/uL (ref 150–400)
RBC: 3.39 MIL/uL — ABNORMAL LOW (ref 3.87–5.11)
RDW: 13.1 % (ref 11.5–15.5)
WBC: 12.5 10*3/uL — ABNORMAL HIGH (ref 4.0–10.5)

## 2018-01-14 LAB — COMPREHENSIVE METABOLIC PANEL
ALT: 18 U/L (ref 0–44)
AST: 20 U/L (ref 15–41)
Albumin: 3.1 g/dL — ABNORMAL LOW (ref 3.5–5.0)
Alkaline Phosphatase: 104 U/L (ref 38–126)
Anion gap: 8 (ref 5–15)
BUN: 9 mg/dL (ref 6–20)
CHLORIDE: 105 mmol/L (ref 98–111)
CO2: 21 mmol/L — AB (ref 22–32)
CREATININE: 0.61 mg/dL (ref 0.44–1.00)
Calcium: 8.7 mg/dL — ABNORMAL LOW (ref 8.9–10.3)
GFR calc non Af Amer: >60 mL/min (ref >60)
Glucose, Bld: 80 mg/dL (ref 70–99)
POTASSIUM: 3.5 mmol/L (ref 3.5–5.1)
SODIUM: 134 mmol/L — AB (ref 135–145)
Total Bilirubin: 0.6 mg/dL (ref 0.3–1.2)
Total Protein: 7 g/dL (ref 6.5–8.1)

## 2018-01-14 MED ORDER — METOCLOPRAMIDE HCL 10 MG PO TABS: 10.0000 mg | ORAL_TABLET | Freq: Three times a day (TID) | ORAL | 0 refills | Status: AC | PRN

## 2018-01-14 MED ORDER — SODIUM CHLORIDE 0.9 % IV BOLUS
1000.0000 mL | Freq: Once | INTRAVENOUS | Status: AC
  Administered 2018-01-14: 1000 mL via INTRAVENOUS

## 2018-01-14 MED ORDER — METOCLOPRAMIDE HCL 5 MG/ML IJ SOLN
10.0000 mg | Freq: Once | INTRAMUSCULAR | Status: AC
  Administered 2018-01-14: 10 mg via INTRAVENOUS
  Filled 2018-01-14: qty 2

## 2018-01-14 MED ORDER — DEXTROSE-NACL 5-0.9 % IV SOLN
Freq: Once | INTRAVENOUS | Status: AC
  Administered 2018-01-14: 13:00:00 via INTRAVENOUS

## 2018-01-14 NOTE — ED Notes (Signed)
TTS at bedside. 

## 2018-01-14 NOTE — ED Provider Notes (Signed)
[facility] Emergency Department Provider Note   First MD Initiated Contact with Patient 01/14/18 1426     (approximate)  I have reviewed the triage vital signs and the nursing notes.   HISTORY  Chief Complaint Anxiety and Emesis    HPI Shirley Todd is a 38 y.o. female G1, P0 approximately [redacted] weeks pregnant with below list of chronic medical conditions presents to the emergency department with nausea and vomiting with inability to tolerate p.o. x1 day.  Patient also very tearful on arrival admitting to feeling overwhelmed with current situation.  Patient admits to multiple life stressors including the fact that she is currently a single parent as she has separated from her partner.  Patient also admits to financial stressors involving her employment.  Patient denies any suicidal ideation.  Patient does admit to history of depression for which she was taking antidepressants before her pregnancy however patient states that she stopped doing so while receiving artificial insemination and has not resumed taking antidepressants since that time.  Of note patient denies any pelvic pain no vaginal bleeding.   Past Medical History:  Diagnosis Date  . Anxiety   . Crohn's disease ([place])   . Depression     Patient Active Problem List   Diagnosis Date Noted  . Nausea/vomiting in pregnancy 11/12/2017  . Infertility, female 09/16/2017  . Crohn's colitis ([place]) 09/16/2017  . AMA (advanced maternal age) multigravida 79+, first trimester 09/16/2017    Past Surgical History:  Procedure Laterality Date  . APPENDECTOMY  2014  . LARYNGOSCOPY  2015    Prior to Admission medications   Medication Sig Start Date End Date Taking? Authorizing Provider  doxylamine, Sleep, (UNISOM) 25 MG tablet Take 1 tablet (25 mg total) by mouth at bedtime. 11/12/17   Lisette Grinder, CNM  metoCLOPramide (REGLAN) 10 MG tablet Take 1 tablet (10 mg total) by mouth every 6 (six) hours.  11/13/17   Lisette Grinder, CNM  metoCLOPramide (REGLAN) 10 MG tablet Take 1 tablet (10 mg total) by mouth every 8 (eight) hours as needed for nausea. 01/14/18 02/13/18  Gregor Hams, MD  ondansetron (ZOFRAN-ODT) 4 MG disintegrating tablet Take 1 tablet (4 mg total) by mouth every 6 (six) hours as needed for nausea or vomiting. 11/12/17   Lisette Grinder, CNM  promethazine (PHENERGAN) 12.5 MG tablet Take 1-2 tablets (12.5-25 mg total) by mouth every 4 (four) hours as needed for nausea or vomiting. 11/12/17   Lisette Grinder, CNM  promethazine (PHENERGAN) 25 MG suppository Place 1 suppository (25 mg total) rectally every 4 (four) hours as needed for nausea or vomiting. 11/12/17   Lisette Grinder, CNM  pyridOXINE (B-6) 50 MG tablet Take 1 tablet (50 mg total) by mouth 4 (four) times daily -  with meals and at bedtime. 11/12/17   Lisette Grinder, CNM    Allergies No known drug allergies  Family History  Problem Relation Age of Onset  . HIV Mother   . Diabetes Brother     Social History Social History   Tobacco Use  . Smoking status: Former Research scientist (life sciences)  . Smokeless tobacco: Never Used  Substance Use Topics  . Alcohol use: Never    Frequency: Never  . Drug use: Never    Review of Systems Constitutional: No fever/chills Eyes: No visual changes. ENT: No sore throat. Cardiovascular: Denies chest pain. Respiratory: Denies shortness of breath. Gastrointestinal: No abdominal pain.  No nausea, no vomiting.  No diarrhea.  No constipation. Genitourinary: Negative for dysuria.  Musculoskeletal: Negative for neck pain.  Negative for back pain. Integumentary: Negative for rash. Neurological: Negative for headaches, focal weakness or numbness. Psychiatric:As if her feelings of depression  ____________________________________________   PHYSICAL EXAM:  VITAL SIGNS: ED Triage Vitals  Enc Vitals Group     BP 01/14/18 1012 113/74     Pulse Rate 01/14/18 1012 (!) 105     Resp  01/14/18 1012 20     Temp 01/14/18 1012 98.6 F (37 C)     Temp Source 01/14/18 1012 Oral     SpO2 01/14/18 1012 98 %     Weight 01/14/18 1009 108.9 kg (240 lb)     Height 01/14/18 1009 1.727 m (5' 8" )     Head Circumference --      Peak Flow --      Pain Score 01/14/18 1009 4     Pain Loc --      Pain Edu? --      Excl. in [place]? --     Constitutional: Alert and oriented.  Crying  eyes: Conjunctivae are normal.  Mouth/Throat: Mucous membranes are moist.  Oropharynx non-erythematous. Neck: No stridor.   Cardiovascular: Normal rate, regular rhythm. Good peripheral circulation. Grossly normal heart sounds. Respiratory: Normal respiratory effort.  No retractions. Lungs CTAB. Gastrointestinal: Soft and nontender. No distention.  Musculoskeletal: No lower extremity tenderness nor edema. No gross deformities of extremities. Neurologic:  Normal speech and language. No gross focal neurologic deficits are appreciated.  Skin:  Skin is warm, dry and intact. No rash noted. Psychiatric: Depressed mood  ____________________________________________   LABS (all labs ordered are listed, but only abnormal results are displayed)  Labs Reviewed  CBC - Abnormal; Notable for the following components:      Result Value   WBC 12.5 (*)    RBC 3.39 (*)    Hemoglobin 10.6 (*)    HCT 33.1 (*)    All other components within normal limits  COMPREHENSIVE METABOLIC PANEL - Abnormal; Notable for the following components:   Sodium 134 (*)    CO2 21 (*)    Calcium 8.7 (*)    Albumin 3.1 (*)    All other components within normal limits  URINALYSIS, ROUTINE W REFLEX MICROSCOPIC - Abnormal; Notable for the following components:   Color, Urine YELLOW (*)    APPearance CLOUDY (*)    Ketones, ur 80 (*)    Protein, ur 30 (*)    Leukocytes, UA SMALL (*)    Bacteria, UA RARE (*)    Squamous Epithelial / LPF >50 (*)    All other components within normal limits    ________________   Procedures   ____________________________________________   INITIAL IMPRESSION / ASSESSMENT AND PLAN / ED COURSE  As part of my medical decision making, I reviewed the following data within the electronic MEDICAL RECORD NUMBER   38 year old female presented with above-stated history and physical exam medically concerning for hyperemesis gravidarum.  Patient given 1 L D5 normal saline and 1 L normal saline.  Patient also given IV Reglan.  Patient able to tolerate eating and drinking following these interventions.  Regarding the patient's mental status I spoke with the patient at length regarding her stressors.  I subsequently had the patient evaluated by TTS staff.  Patient given resources for outpatient psychiatric follow-up.  I encouraged the patient to return to the emergency department if necessary for any concerns. Clinical Course as of Jan 14 1609  Tue Jan 14, 2018  1249 Ketones,  ur(!): 80 [RB]    Clinical Course User Index [RB] Gregor Hams, MD    ____________________________________________  FINAL CLINICAL IMPRESSION(S) / ED DIAGNOSES  Final diagnoses:  Hyperemesis gravidarum  Depression during pregnancy in third trimester     MEDICATIONS GIVEN DURING THIS VISIT:  Medications  sodium chloride 0.9 % bolus 1,000 mL (1,000 mLs Intravenous Bolus 01/14/18 1322)  dextrose 5 %-0.9 % sodium chloride infusion ( Intravenous Bolus 01/14/18 1321)  metoCLOPramide (REGLAN) injection 10 mg (10 mg Intravenous Given 01/14/18 1330)     ED Discharge Orders         Ordered    metoCLOPramide (REGLAN) 10 MG tablet  Every 8 hours PRN     01/14/18 1553           Note:  This document was prepared using Dragon voice recognition software and may include unintentional dictation errors.    Gregor Hams, MD 01/15/18 2119

## 2018-01-14 NOTE — ED Triage Notes (Signed)
Pt reports is [redacted] weeks pregnant and can't keep anything down. Pt also reports is very overwhelmed with life right now. Pt is tearful in triage, denies SI. Pt does report some abd cramping.

## 2018-01-14 NOTE — ED Notes (Signed)
Pt given crackers, PB, and water for nausea trial.

## 2018-01-14 NOTE — BH Assessment (Addendum)
Assessment Note  Shirley Todd is an 38 y.o. female who presents to ED with c/o being [redacted] weeks pregnant and can't keep anything down. Pt also reports she is very overwhelmed with life right now. Pt denied SI/HI/AVH. Pt was adamant that she has no desire to harm herself, her baby, or anyone else. Pt reports past diagnosis of depression and prescribed Lexapro to treat her sxs. Pt discontinued this prescription when she started trying to get pregnant. She reports she stopped taking this medication "a little over a year ago". She reports her significant other moved out of their home and she is overwhelmed with financial burden. She was receiving outpatient therapy but had to discontinue her therapy due to her insurance not covering the cost of service. Pt has little to no supports in this area and is contemplating on moving back to [facility] to be closer to friends and family. Pt was pleasant; however, appears down and dejected.  This Probation officer provided pt with some local financial assistance programs to help alleviate some her financial strain. Pt was receptive of this information provided to her.   Diagnosis: Unspecified Depressive Disorder, by history  Past Medical History:  Past Medical History:  Diagnosis Date  . Anxiety   . Crohn's disease ([place])   . Depression     Past Surgical History:  Procedure Laterality Date  . APPENDECTOMY  2014  . LARYNGOSCOPY  2015    Family History:  Family History  Problem Relation Age of Onset  . HIV Mother   . Diabetes Brother     Social History:  reports that she has quit smoking. She has never used smokeless tobacco. She reports that she does not drink alcohol or use drugs.  Additional Social History:  Alcohol / Drug Use Pain Medications: See MAR Prescriptions: See MAR Over the Counter: See MAR History of alcohol / drug use?: No history of alcohol / drug abuse  CIWA: CIWA-Ar BP: 113/74 Pulse Rate: (!) 105 COWS:    Allergies: No Known  Allergies  Home Medications: (Not in a hospital admission)   OB/GYN Status:  Patient's last menstrual period was 06/17/2017.  General Assessment Data Location of Assessment: [facility] ED TTS Assessment: In system Is this a Tele or Face-to-Face Assessment?: Face-to-Face Is this an Initial Assessment or a Re-assessment for this encounter?: Initial Assessment Patient Accompanied by:: N/A Language Other than English: No Living Arrangements: Other (Comment)(Independent Living) What gender do you identify as?: Female Marital status: Single Maiden name: n/a Pregnancy Status: Yes (Comment: include estimated delivery date)([redacted] weeks pregnant) Living Arrangements: Alone Can pt return to current living arrangement?: Yes Admission Status: Voluntary Is patient capable of signing voluntary admission?: Yes Referral Source: Self/Family/Friend Insurance type: [place] Screening Exam ([place]) Medical Exam completed: Yes  Crisis Care Plan Living Arrangements: Alone Legal Guardian: Other:(Self) Name of Psychiatrist: None Reported Name of Therapist: Jeneen Rinks Robinson([facility])  Education Status Is patient currently in school?: No Is the patient employed, unemployed or receiving disability?: Employed  Risk to self with the past 6 months Suicidal Ideation: No Has patient been a risk to self within the past 6 months prior to admission? : No Suicidal Intent: No Has patient had any suicidal intent within the past 6 months prior to admission? : No Is patient at risk for suicide?: No Suicidal Plan?: No Has patient had any suicidal plan within the past 6 months prior to admission? : No Access to Means: No What has been your use of drugs/alcohol  within the last 12 months?: None Reported Previous Attempts/Gestures: No How many times?: 0 Other Self Harm Risks: None Reported Triggers for Past Attempts: None known Intentional Self Injurious Behavior: None Family Suicide History:  No Recent stressful life event(s): Financial Problems Persecutory voices/beliefs?: No Depression: Yes Depression Symptoms: Tearfulness Substance abuse history and/or treatment for substance abuse?: No Suicide prevention information given to non-admitted patients: Not applicable  Risk to Others within the past 6 months Homicidal Ideation: No Does patient have any lifetime risk of violence toward others beyond the six months prior to admission? : No Thoughts of Harm to Others: No Current Homicidal Intent: No Current Homicidal Plan: No Access to Homicidal Means: No Identified Victim: None History of harm to others?: No Assessment of Violence: None Noted Violent Behavior Description: None Does patient have access to weapons?: No Criminal Charges Pending?: No Does patient have a court date: No Is patient on probation?: No  Psychosis Hallucinations: None noted Delusions: None noted  Mental Status Report Appearance/Hygiene: Other (Comment)(Street clothes) Eye Contact: Good Motor Activity: Freedom of movement Speech: Logical/coherent Level of Consciousness: Alert Mood: Depressed, Sad, Pleasant Affect: Flat Anxiety Level: Minimal Thought Processes: Coherent, Relevant Judgement: Unimpaired Orientation: Person, Place, Time, Situation, Appropriate for developmental age Obsessive Compulsive Thoughts/Behaviors: None  Cognitive Functioning Concentration: Normal Memory: Recent Intact, Remote Intact Is patient IDD: No Insight: Good Impulse Control: Good Appetite: Good Have you had any weight changes? : Gain([redacted] weeks Pregnant) Amount of the weight change? (lbs): (UKN) Sleep: Decreased Total Hours of Sleep: 6 Vegetative Symptoms: None  ADLScreening [facility] Assessment Services) Patient's cognitive ability adequate to safely complete daily activities?: Yes Patient able to express need for assistance with ADLs?: Yes Independently performs ADLs?: Yes (appropriate for developmental  age)  Prior Inpatient Therapy Prior Inpatient Therapy: No  Prior Outpatient Therapy Prior Outpatient Therapy: Yes Prior Therapy Dates: 2019 Prior Therapy Facilty/Provider(s): [facility] Reason for Treatment: Depression Does patient have an ACCT team?: No Does patient have Intensive In-House Services?  : No Does patient have [facility] services? : No Does patient have P4CC services?: No  ADL Screening (condition at time of admission) Patient's cognitive ability adequate to safely complete daily activities?: Yes Patient able to express need for assistance with ADLs?: Yes Independently performs ADLs?: Yes (appropriate for developmental age)       Abuse/Neglect Assessment (Assessment to be complete while patient is alone) Abuse/Neglect Assessment Can Be Completed: Yes Physical Abuse: Denies Verbal Abuse: Denies Sexual Abuse: Denies Exploitation of patient/patient's resources: Denies Self-Neglect: Denies Values / Beliefs Cultural Requests During Hospitalization: None Spiritual Requests During Hospitalization: None Consults Spiritual Care Consult Needed: No Social Work Consult Needed: No         Child/Adolescent Assessment Running Away Risk: (Patient is an adult)  Disposition:  Disposition Initial Assessment Completed for this Encounter: Yes Disposition of Patient: Discharge(Discharge with community resources) Patient refused recommended treatment: No Mode of transportation if patient is discharged/movement?: Car Patient referred to: Outpatient clinic referral  On Site Evaluation by:   Reviewed with Physician:    Frederich Cha 01/14/2018 2:44 PM

## 2018-06-16 ENCOUNTER — Encounter (HOSPITAL_COMMUNITY): Payer: Self-pay

## 2022-09-16 ENCOUNTER — Ambulatory Visit (HOSPITAL_COMMUNITY)
Admission: EM | Admit: 2022-09-16 | Discharge: 2022-09-16 | Disposition: A | Discharge status: Home / Self Care | Payer: 59

## 2022-09-16 ENCOUNTER — Other Ambulatory Visit: Payer: Self-pay

## 2022-09-16 ENCOUNTER — Encounter (HOSPITAL_COMMUNITY): Payer: Self-pay | Admitting: Emergency Medicine

## 2022-09-16 DIAGNOSIS — T192XXA Foreign body in vulva and vagina, initial encounter: Secondary | ICD-10-CM

## 2022-09-16 NOTE — Discharge Instructions (Signed)
The condom was removed successfully today. No additional treatment is needed. There is no concern for infection as the foreign body was removed same day. There is no need to perform a douche or use any topical creams.

## 2022-09-16 NOTE — ED Triage Notes (Signed)
Pt states she believes that she has a foreign body on her vagina.

## 2022-09-16 NOTE — ED Provider Notes (Signed)
[facility]    CSN: [id number] Arrival date & time: 09/16/22  1613      History   Chief Complaint Chief Complaint  Patient presents with   Foreign Body in Vagina    HPI Shirley Todd is a 42 y.o. female.   Pleasant 42 year old female presents today due to concerns of a foreign body retained in her vagina.  She states she was having sex this morning, she and her partner were wearing a condom.  Upon finishing, they noticed the condom is no longer in place.  They could not find it in the bed, they also tried to retrieve it from the vagina, but could not get it. She denies pelvic pain, vaginal discharge or any additional symptoms.   Foreign Body in Vagina    Past Medical History:  Diagnosis Date   Anxiety    Crohn's disease [facility])    Depression     Patient Active Problem List   Diagnosis Date Noted   Nausea/vomiting in pregnancy 11/12/2017   Infertility, female 09/16/2017   Crohn's colitis ([facility]) 09/16/2017   AMA (advanced maternal age) multigravida 35+, first trimester 09/16/2017    Past Surgical History:  Procedure Laterality Date   APPENDECTOMY  2014   LARYNGOSCOPY  2015    OB History     Gravida  1   Para      Term      Preterm      AB      Living         SAB      IAB      Ectopic      Multiple      Live Births               Home Medications    Prior to Admission medications   Medication Sig Start Date End Date Taking? Authorizing Provider  doxylamine, Sleep, (UNISOM) 25 MG tablet Take 1 tablet (25 mg total) by mouth at bedtime. 11/12/17   Genia Del, CNM  metoCLOPramide (REGLAN) 10 MG tablet Take 1 tablet (10 mg total) by mouth every 6 (six) hours. 11/13/17   Genia Del, CNM  metoCLOPramide (REGLAN) 10 MG tablet Take 1 tablet (10 mg total) by mouth every 8 (eight) hours as needed for nausea. 01/14/18 02/13/18  Darci Current, MD  ondansetron (ZOFRAN-ODT) 4 MG disintegrating tablet Take 1 tablet (4 mg  total) by mouth every 6 (six) hours as needed for nausea or vomiting. 11/12/17   Genia Del, CNM  promethazine (PHENERGAN) 12.5 MG tablet Take 1-2 tablets (12.5-25 mg total) by mouth every 4 (four) hours as needed for nausea or vomiting. 11/12/17   Genia Del, CNM  promethazine (PHENERGAN) 25 MG suppository Place 1 suppository (25 mg total) rectally every 4 (four) hours as needed for nausea or vomiting. 11/12/17   Genia Del, CNM  pyridOXINE (B-6) 50 MG tablet Take 1 tablet (50 mg total) by mouth 4 (four) times daily -  with meals and at bedtime. 11/12/17   Genia Del, CNM    Family History Family History  Problem Relation Age of Onset   HIV Mother    Diabetes Brother     Social History Social History   Tobacco Use   Smoking status: Former   Smokeless tobacco: Never  Advertising account planner   Vaping status: Never Used  Substance Use Topics   Alcohol use: Never   Drug use: Never     Allergies   Patient has no known  allergies.   Review of Systems Review of Systems As per HPI  Physical Exam Triage Vital Signs ED Triage Vitals  Encounter Vitals Group     BP 09/16/22 1700 123/65     Systolic BP Percentile --      Diastolic BP Percentile --      Pulse Rate 09/16/22 1700 80     Resp 09/16/22 1700 18     Temp 09/16/22 1700 98.1 F (36.7 C)     Temp Source 09/16/22 1700 Oral     SpO2 09/16/22 1700 100 %     Weight --      Height --      Head Circumference --      Peak Flow --      Pain Score 09/16/22 1701 0     Pain Loc --      Pain Education --      Exclude from Growth Chart --    No data found.  Updated Vital Signs BP 123/65 (BP Location: Right Arm)   Pulse 80   Temp 98.1 F (36.7 C) (Oral)   Resp 18   LMP 09/09/2022 (Approximate)   SpO2 100%   Visual Acuity Right Eye Distance:   Left Eye Distance:   Bilateral Distance:    Right Eye Near:   Left Eye Near:    Bilateral Near:     Physical Exam Vitals and nursing note reviewed. Exam  conducted with a chaperone present.  Constitutional:      General: She is not in acute distress.    Appearance: Normal appearance. She is well-developed and normal weight. She is not ill-appearing, toxic-appearing or diaphoretic.  HENT:     Head: Normocephalic.  Cardiovascular:     Rate and Rhythm: Normal rate.     Heart sounds: No murmur heard. Pulmonary:     Effort: Pulmonary effort is normal. No respiratory distress.     Breath sounds: No wheezing.  Abdominal:     General: Abdomen is flat. There is no distension. There are no signs of injury.     Palpations: Abdomen is soft. There is no shifting dullness, fluid wave, hepatomegaly, splenomegaly or pulsatile mass.     Tenderness: There is no abdominal tenderness. There is no guarding or rebound. Negative signs include Murphy's sign, Rovsing's sign, McBurney's sign, psoas sign and obturator sign.  Genitourinary:    General: Normal vulva.     Pubic Area: No rash or pubic lice.      Labia:        Right: No rash, tenderness, lesion or injury.        Left: No rash, tenderness, lesion or injury.      Urethra: No prolapse, urethral pain, urethral swelling or urethral lesion.     Vagina: Foreign body (retained condom noted to posterior vaginal vault; removed successfully) present. No signs of injury. No vaginal discharge, erythema, tenderness, bleeding, lesions or prolapsed vaginal walls.     Rectum: Normal.  Lymphadenopathy:     Lower Body: No right inguinal adenopathy. No left inguinal adenopathy.  Neurological:     Mental Status: She is alert.      UC Treatments / Results  Labs (all labs ordered are listed, but only abnormal results are displayed) Labs Reviewed - No data to display  EKG   Radiology No results found.  Procedures Procedures (including critical care time)  Medications Ordered in UC Medications - No data to display  Initial Impression / Assessment and Plan /  UC Course  I have reviewed the triage vital signs  and the nursing notes.  Pertinent labs & imaging results that were available during my care of the patient were reviewed by me and considered in my medical decision making (see chart for details).     FB in vagina - removed successfully in office. No indication for additional tx. No vaginal discharge or concerning findings noted on exam.    Final Clinical Impressions(s) / UC Diagnoses   Final diagnoses:  FB vulva/vagina, initial encounter     Discharge Instructions      The condom was removed successfully today. No additional treatment is needed. There is no concern for infection as the foreign body was removed same day. There is no need to perform a douche or use any topical creams.     ED Prescriptions   None    PDMP not reviewed this encounter.   Maretta Bees, [place] 09/16/22 1727
